# Patient Record
Sex: Female | Born: 1955 | Race: White | Hispanic: No | Marital: Married | Smoking: Never smoker
Health system: Southern US, Community
[De-identification: ages and names within clinical notes are randomized; demographics above are authoritative.]

## PROBLEM LIST (undated history)

## (undated) DIAGNOSIS — E7800 Pure hypercholesterolemia, unspecified: Secondary | ICD-10-CM

## (undated) DIAGNOSIS — J302 Other seasonal allergic rhinitis: Secondary | ICD-10-CM

## (undated) DIAGNOSIS — I10 Essential (primary) hypertension: Secondary | ICD-10-CM

## (undated) DIAGNOSIS — D219 Benign neoplasm of connective and other soft tissue, unspecified: Secondary | ICD-10-CM

## (undated) DIAGNOSIS — E049 Nontoxic goiter, unspecified: Secondary | ICD-10-CM

## (undated) DIAGNOSIS — K635 Polyp of colon: Secondary | ICD-10-CM

## (undated) HISTORY — DX: Benign neoplasm of connective and other soft tissue, unspecified: D21.9

## (undated) HISTORY — DX: Essential (primary) hypertension: I10

---
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
## 2003-07-14 HISTORY — PX: HYSTEROSCOPY W/ ENDOMETRIAL ABLATION: SUR665

## 2003-07-29 ENCOUNTER — Other Ambulatory Visit: Payer: Self-pay

## 2004-03-15 HISTORY — PX: LAPAROSCOPIC TOTAL HYSTERECTOMY: SUR800

## 2004-08-14 ENCOUNTER — Ambulatory Visit: Payer: Self-pay | Admitting: Unknown Physician Specialty

## 2005-01-26 ENCOUNTER — Ambulatory Visit: Payer: Self-pay | Admitting: Unknown Physician Specialty

## 2005-08-13 HISTORY — PX: OTHER SURGICAL HISTORY: SHX169

## 2005-08-19 ENCOUNTER — Ambulatory Visit: Payer: Self-pay | Admitting: Otolaryngology

## 2005-09-02 ENCOUNTER — Ambulatory Visit: Payer: Self-pay | Admitting: Unknown Physician Specialty

## 2006-09-09 ENCOUNTER — Ambulatory Visit: Payer: Self-pay | Admitting: Unknown Physician Specialty

## 2007-09-29 ENCOUNTER — Ambulatory Visit: Payer: Self-pay | Admitting: Unknown Physician Specialty

## 2008-06-13 ENCOUNTER — Ambulatory Visit: Payer: Self-pay | Admitting: Nurse Practitioner

## 2008-06-21 ENCOUNTER — Ambulatory Visit: Payer: Self-pay | Admitting: Nurse Practitioner

## 2008-11-29 ENCOUNTER — Ambulatory Visit: Payer: Self-pay | Admitting: Unknown Physician Specialty

## 2008-12-09 ENCOUNTER — Ambulatory Visit: Payer: Self-pay | Admitting: Unknown Physician Specialty

## 2008-12-23 ENCOUNTER — Ambulatory Visit (HOSPITAL_COMMUNITY): Admission: RE | Admit: 2008-12-23 | Discharge: 2008-12-24 | Payer: Self-pay | Admitting: Neurosurgery

## 2009-03-15 HISTORY — PX: BACK SURGERY: SHX140

## 2009-06-16 ENCOUNTER — Ambulatory Visit: Payer: Self-pay | Admitting: Unknown Physician Specialty

## 2009-12-01 ENCOUNTER — Ambulatory Visit: Payer: Self-pay | Admitting: Unknown Physician Specialty

## 2010-06-18 LAB — CBC
HCT: 40 % (ref 36.0–46.0)
Hemoglobin: 13.4 g/dL (ref 12.0–15.0)
MCHC: 33.5 g/dL (ref 30.0–36.0)
MCV: 89.3 fL (ref 78.0–100.0)
Platelets: 274 10*3/uL (ref 150–400)
RBC: 4.48 MIL/uL (ref 3.87–5.11)
RDW: 12.6 % (ref 11.5–15.5)
WBC: 5.6 10*3/uL (ref 4.0–10.5)

## 2010-12-14 ENCOUNTER — Ambulatory Visit: Payer: Self-pay | Admitting: Gastroenterology

## 2010-12-14 HISTORY — PX: COLONOSCOPY: SHX174

## 2010-12-17 LAB — PATHOLOGY REPORT

## 2010-12-22 ENCOUNTER — Ambulatory Visit: Payer: Self-pay | Admitting: Unknown Physician Specialty

## 2016-03-30 ENCOUNTER — Telehealth: Payer: Self-pay

## 2016-03-30 NOTE — Telephone Encounter (Signed)
[phone] patient received letter to schedule tcs

## 2016-04-06 ENCOUNTER — Telehealth: Payer: Self-pay

## 2016-04-06 ENCOUNTER — Other Ambulatory Visit: Payer: Self-pay

## 2016-04-06 DIAGNOSIS — D369 Benign neoplasm, unspecified site: Secondary | ICD-10-CM

## 2016-04-06 NOTE — Telephone Encounter (Signed)
See separate triage.  

## 2016-04-06 NOTE — Telephone Encounter (Signed)
Gastroenterology Pre-Procedure Review  Request Date: 04/06/2016 Requesting Physician: Angelina Ok, FNP PATIENT REVIEW QUESTIONS: The patient responded to the following health history questions as indicated:    1. Diabetes Melitis: no 2. Joint replacements in the past 12 months: no 3. Major health problems in the past 3 months: no 4. Has an artificial valve or MVP: no 5. Has a defibrillator: no 6. Has been advised in past to take antibiotics in advance of a procedure like teeth cleaning: no 7. Family history of colon cancer: no  8. Alcohol Use: no 9. History of sleep apnea: no  10. History of coronary artery or other vascular stents placed within the last 12 months: no    MEDICATIONS & ALLERGIES:    Patient reports the following regarding taking any blood thinners:   Plavix? no Aspirin? no Coumadin? no Brilinta? no Xarelto? no Eliquis? no Pradaxa? no Savaysa? no Effient? no  Patient confirms/reports the following medications:  Current Outpatient Prescriptions  Medication Sig Dispense Refill  . fluticasone (FLONASE) 50 MCG/ACT nasal spray Place into both nostrils daily.    Marland Kitchen loratadine (CLARITIN) 10 MG tablet Take 10 mg by mouth daily as needed for allergies.    . simvastatin (ZOCOR) 20 MG tablet Take 20 mg by mouth daily.     No current facility-administered medications for this visit.     Patient confirms/reports the following allergies:  No Known Allergies  No orders of the defined types were placed in this encounter.   AUTHORIZATION INFORMATION Primary Insurance:   ID #:   Group #:  Pre-Cert / Auth required: Pre-Cert / Auth #:   Secondary Insurance:  ID #:   Group #:  Pre-Cert / Auth required:  Pre-Cert / Auth #:   SCHEDULE INFORMATION: Procedure has been scheduled as follows:  Date:  05/06/2016         Time:  7:30 AM Location: [facility] Short Stay   This Gastroenterology Pre-Precedure Review Form is being routed to the following provider(s):  R. Garfield Cornea, MD

## 2016-04-07 MED ORDER — NA SULFATE-K SULFATE-MG SULF 17.5-3.13-1.6 GM/177ML PO SOLN: 1.0000 | ORAL | 0 refills | Status: DC

## 2016-04-07 NOTE — Telephone Encounter (Signed)
Rx sent to the pharmacy and instructions mailed to pt.  

## 2016-04-07 NOTE — Telephone Encounter (Signed)
Ok to schedule.

## 2016-04-08 MED ORDER — PEG 3350-KCL-NA BICARB-NACL 420 G PO SOLR: 4000.0000 mL | ORAL | 0 refills | Status: DC

## 2016-04-08 NOTE — Telephone Encounter (Signed)
Suprep not covered by insurance. Trilyte sent in new instructions mailed to pt.

## 2016-04-30 ENCOUNTER — Other Ambulatory Visit: Payer: Self-pay | Admitting: Certified Nurse Midwife

## 2016-05-04 ENCOUNTER — Other Ambulatory Visit: Payer: Self-pay | Admitting: Certified Nurse Midwife

## 2016-05-04 DIAGNOSIS — Z1231 Encounter for screening mammogram for malignant neoplasm of breast: Secondary | ICD-10-CM

## 2016-05-05 NOTE — Telephone Encounter (Signed)
No PA needed for TCS per Algis Greenhouse on 05/05/16 @ 3:40 pm(with [employer])

## 2016-05-06 ENCOUNTER — Encounter (HOSPITAL_COMMUNITY)
Admission: RE | Disposition: A | Discharge status: Home Health | Payer: Self-pay | Source: Ambulatory Visit | Attending: Internal Medicine

## 2016-05-06 ENCOUNTER — Encounter (HOSPITAL_COMMUNITY): Payer: Self-pay | Admitting: *Deleted

## 2016-05-06 ENCOUNTER — Ambulatory Visit (HOSPITAL_COMMUNITY)
Admission: RE | Admit: 2016-05-06 | Discharge: 2016-05-06 | Disposition: A | Discharge status: Home Health | Payer: BLUE CROSS/BLUE SHIELD | Source: Ambulatory Visit | Attending: Internal Medicine | Admitting: Internal Medicine

## 2016-05-06 DIAGNOSIS — Z1211 Encounter for screening for malignant neoplasm of colon: Secondary | ICD-10-CM | POA: Diagnosis present

## 2016-05-06 DIAGNOSIS — K5730 Diverticulosis of large intestine without perforation or abscess without bleeding: Secondary | ICD-10-CM | POA: Diagnosis not present

## 2016-05-06 DIAGNOSIS — D369 Benign neoplasm, unspecified site: Secondary | ICD-10-CM

## 2016-05-06 DIAGNOSIS — Z86010 Personal history of colonic polyps: Secondary | ICD-10-CM | POA: Insufficient documentation

## 2016-05-06 DIAGNOSIS — K644 Residual hemorrhoidal skin tags: Secondary | ICD-10-CM | POA: Insufficient documentation

## 2016-05-06 DIAGNOSIS — E7800 Pure hypercholesterolemia, unspecified: Secondary | ICD-10-CM | POA: Insufficient documentation

## 2016-05-06 DIAGNOSIS — K648 Other hemorrhoids: Secondary | ICD-10-CM | POA: Insufficient documentation

## 2016-05-06 HISTORY — DX: Polyp of colon: K63.5

## 2016-05-06 HISTORY — DX: Nontoxic goiter, unspecified: E04.9

## 2016-05-06 HISTORY — DX: Other seasonal allergic rhinitis: J30.2

## 2016-05-06 HISTORY — DX: Pure hypercholesterolemia, unspecified: E78.00

## 2016-05-06 HISTORY — PX: COLONOSCOPY: SHX5424

## 2016-05-06 SURGERY — COLONOSCOPY
Anesthesia: Moderate Sedation

## 2016-05-06 MED ORDER — MIDAZOLAM HCL 5 MG/5ML IJ SOLN
INTRAMUSCULAR | Status: AC
  Filled 2016-05-06: qty 10

## 2016-05-06 MED ORDER — MEPERIDINE HCL 100 MG/ML IJ SOLN
INTRAMUSCULAR | Status: DC | PRN
  Administered 2016-05-06 (×2): 25 mg via INTRAVENOUS
  Administered 2016-05-06: 50 mg via INTRAVENOUS

## 2016-05-06 MED ORDER — MIDAZOLAM HCL 5 MG/5ML IJ SOLN
INTRAMUSCULAR | Status: DC | PRN
  Administered 2016-05-06: 1 mg via INTRAVENOUS
  Administered 2016-05-06: 2 mg via INTRAVENOUS
  Administered 2016-05-06: 1 mg via INTRAVENOUS
  Administered 2016-05-06: 2 mg via INTRAVENOUS

## 2016-05-06 MED ORDER — STERILE WATER FOR IRRIGATION IR SOLN
Status: DC | PRN
  Administered 2016-05-06: 08:00:00

## 2016-05-06 MED ORDER — SODIUM CHLORIDE 0.9 % IV SOLN
INTRAVENOUS | Status: DC
  Administered 2016-05-06: 07:00:00 via INTRAVENOUS

## 2016-05-06 MED ORDER — ONDANSETRON HCL 4 MG/2ML IJ SOLN
INTRAMUSCULAR | Status: AC
  Filled 2016-05-06: qty 2

## 2016-05-06 MED ORDER — ONDANSETRON HCL 4 MG/2ML IJ SOLN
INTRAMUSCULAR | Status: DC | PRN
  Administered 2016-05-06: 4 mg via INTRAVENOUS

## 2016-05-06 MED ORDER — MEPERIDINE HCL 100 MG/ML IJ SOLN
INTRAMUSCULAR | Status: AC
  Filled 2016-05-06: qty 2

## 2016-05-06 NOTE — Discharge Instructions (Signed)
Colonoscopy Discharge Instructions  Read the instructions outlined below and refer to this sheet in the next few weeks. These discharge instructions provide you with general information on caring for yourself after you leave the hospital. Your doctor may also give you specific instructions. While your treatment has been planned according to the most current medical practices available, unavoidable complications occasionally occur. If you have any problems or questions after discharge, call Dr. Gala Romney at [phone]. ACTIVITY  You may resume your regular activity, but move at a slower pace for the next 24 hours.   Take frequent rest periods for the next 24 hours.   Walking will help get rid of the air and reduce the bloated feeling in your belly (abdomen).   No driving for 24 hours (because of the medicine (anesthesia) used during the test).    Do not sign any important legal documents or operate any machinery for 24 hours (because of the anesthesia used during the test).  NUTRITION  Drink plenty of fluids.   You may resume your normal diet as instructed by your doctor.   Begin with a light meal and progress to your normal diet. Heavy or fried foods are harder to digest and may make you feel sick to your stomach (nauseated).   Avoid alcoholic beverages for 24 hours or as instructed.  MEDICATIONS  You may resume your normal medications unless your doctor tells you otherwise.  WHAT YOU CAN EXPECT TODAY  Some feelings of bloating in the abdomen.   Passage of more gas than usual.   Spotting of blood in your stool or on the toilet paper.  IF YOU HAD POLYPS REMOVED DURING THE COLONOSCOPY:  No aspirin products for 7 days or as instructed.   No alcohol for 7 days or as instructed.   Eat a soft diet for the next 24 hours.  FINDING OUT THE RESULTS OF YOUR TEST Not all test results are available during your visit. If your test results are not back during the visit, make an appointment  with your caregiver to find out the results. Do not assume everything is normal if you have not heard from your caregiver or the medical facility. It is important for you to follow up on all of your test results.  SEEK IMMEDIATE MEDICAL ATTENTION IF:  You have more than a spotting of blood in your stool.   Your belly is swollen (abdominal distention).   You are nauseated or vomiting.   You have a temperature over 101.   You have abdominal pain or discomfort that is severe or gets worse throughout the day.    Colon diverticulosis information provided  Repeat colonoscopy in 5 years   Diverticulosis Diverticulosis is the condition that develops when small pouches (diverticula) form in the wall of your colon. Your colon, or large intestine, is where water is absorbed and stool is formed. The pouches form when the inside layer of your colon pushes through weak spots in the outer layers of your colon. CAUSES  No one knows exactly what causes diverticulosis. RISK FACTORS  Being older than 44. Your risk for this condition increases with age. Diverticulosis is rare in people younger than 40 years. By age 67, almost everyone has it.  Eating a low-fiber diet.  Being frequently constipated.  Being overweight.  Not getting enough exercise.  Smoking.  Taking over-the-counter pain medicines, like aspirin and ibuprofen. SYMPTOMS  Most people with diverticulosis do not have symptoms. DIAGNOSIS  Because diverticulosis often has no  symptoms, health care providers often discover the condition during an exam for other colon problems. In many cases, a health care provider will diagnose diverticulosis while using a flexible scope to examine the colon (colonoscopy). TREATMENT  If you have never developed an infection related to diverticulosis, you may not need treatment. If you have had an infection before, treatment may include:  Eating more fruits, vegetables, and grains.  Taking a fiber  supplement.  Taking a live bacteria supplement (probiotic).  Taking medicine to relax your colon. HOME CARE INSTRUCTIONS   Drink at least 6-8 glasses of water each day to prevent constipation.  Try not to strain when you have a bowel movement.  Keep all follow-up appointments. If you have had an infection before:  Increase the fiber in your diet as directed by your health care provider or dietitian.  Take a dietary fiber supplement if your health care provider approves.  Only take medicines as directed by your health care provider. SEEK MEDICAL CARE IF:   You have abdominal pain.  You have bloating.  You have cramps.  You have not gone to the bathroom in 3 days. SEEK IMMEDIATE MEDICAL CARE IF:   Your pain gets worse.  Yourbloating becomes very bad.  You have a fever or chills, and your symptoms suddenly get worse.  You begin vomiting.  You have bowel movements that are bloody or black. MAKE SURE YOU:  Understand these instructions.  Will watch your condition.  Will get help right away if you are not doing well or get worse. This information is not intended to replace advice given to you by your health care provider. Make sure you discuss any questions you have with your health care provider.

## 2016-05-06 NOTE — Op Note (Signed)
[facility] Patient Name: Adrienne Terrell Procedure Date: 05/06/2016 7:26 AM MRN: [id number] Date of Birth: 12-Apr-1955 Attending MD: Norvel Richards , MD CSN: [id number] Age: 61 Admit Type: Outpatient Procedure:                Colonoscopy - Surveillance Indications:              High risk colon cancer surveillance: Personal                            history of colonic polyps Providers:                Norvel Richards, MD, Otis Peak B. Sharon Seller, RN,                            Isabella Stalling, Technician Referring MD:              Medicines:                Midazolam 6 mg IV, Meperidine 123XX123 mg IV Complications:            No immediate complications. Estimated Blood Loss:     Estimated blood loss: none. Procedure:                Pre-Anesthesia Assessment:                           - Prior to the procedure, a History and Physical                            was performed, and patient medications and                            allergies were reviewed. The patient's tolerance of                            previous anesthesia was also reviewed. The risks                            and benefits of the procedure and the sedation                            options and risks were discussed with the patient.                            All questions were answered, and informed consent                            was obtained. Prior Anticoagulants: The patient has                            taken no previous anticoagulant or antiplatelet                            agents. ASA Grade Assessment: II - A patient with  mild systemic disease. After reviewing the risks                            and benefits, the patient was deemed in                            satisfactory condition to undergo the procedure.                           After obtaining informed consent, the colonoscope                            was passed under direct vision. Throughout the             procedure, the patient's blood pressure, pulse, and                            oxygen saturations were monitored continuously. The                            [id number]) was introduced through the anus                            and advanced to the the cecum, identified by                            appendiceal orifice and ileocecal valve. The                            ileocecal valve, appendiceal orifice, and rectum                            were photographed. The entire colon was well                            visualized. The colonoscopy was performed without                            difficulty. The patient tolerated the procedure                            well. The quality of the bowel preparation was                            adequate. The ileocecal valve, appendiceal orifice,                            and rectum were photographed. Scope In: 7:53:45 AM Scope Out: 8:17:23 AM Scope Withdrawal Time: 0 hours 11 minutes 14 seconds  Total Procedure Duration: 0 hours 23 minutes 38 seconds  Findings:      The perianal and digital rectal examinations were normal except for       minimal externemorrhoids..      Multiple small and large-mouthed diverticula were found in the sigmoid       colon  and descending colon. Internal hemorrhoids?"grade 1 present.      The entire examined colon appeared normal on direct and retroflexion       views. Impression:               - Diverticulosis in the sigmoid colon and in the                            descending colon.                           - The entire examined colon is normal on direct and                            retroflexion views.                           - No specimens collected. Internal and external                            hemorrhoids Moderate Sedation:      Moderate (conscious) sedation was administered by the endoscopy nurse       and supervised by the endoscopist. The following parameters were        monitored: oxygen saturation, heart rate, blood pressure, respiratory       rate, EKG, adequacy of pulmonary ventilation, and response to care.       Total physician intraservice time was 30 minutes. Recommendation:           - Patient has a contact number available for                            emergencies. The signs and symptoms of potential                            delayed complications were discussed with the                            patient. Return to normal activities tomorrow.                            Written discharge instructions were provided to the                            patient.                           - Resume previous diet.                           - Continue present medications.                           - Repeat colonoscopy in 5 years for surveillance.                           - Return to GI office PRN. Procedure Code(s):        ---  Professional ---                           [phone], Colonoscopy, flexible; diagnostic, including                            collection of specimen(s) by brushing or washing,                            when performed (separate procedure)                           99152, Moderate sedation services provided by the                            same physician or other qualified health care                            professional performing the diagnostic or                            therapeutic service that the sedation supports,                            requiring the presence of an independent trained                            observer to assist in the monitoring of the                            patient's level of consciousness and physiological                            status; initial 15 minutes of intraservice time,                            patient age 60 years or older                           [phone], Moderate sedation services; each additional                            15 minutes intraservice time Diagnosis Code(s):         --- Professional ---                           Z86.010, Personal history of colonic polyps                           K57.30, Diverticulosis of large intestine without                            perforation or abscess without bleeding CPT copyright 2016 American Medical Association. All rights reserved. The codes documented in this report are preliminary and upon coder review may  be revised to meet current compliance  requirements. Cristopher Estimable. Mikeila Burgen, MD Norvel Richards, MD 05/06/2016 8:28:28 AM This report has been signed electronically. Number of Addenda: 0

## 2016-05-06 NOTE — H&P (Signed)
_0 @   Primary Care Physician:  Renee Rival, NP Primary Gastroenterologist:  Dr. Gala Romney  Pre-Procedure History & Physical: HPI:  Adrienne Terrell is a 61 y.o. female here for refills colonoscopy. Reported history of colonic adenomas removed in [place] 5 years ago. No bowel symptoms. No family history of colon cancer.  Past Medical History:  Diagnosis Date  . Colon polyps   . Enlarged thyroid gland   . Hypercholesteremia   . Seasonal allergies     Past Surgical History:  Procedure Laterality Date  . ABDOMINAL HYSTERECTOMY    . BACK SURGERY  2010   approximate date  . COLONOSCOPY    . nasal polyps removed      Prior to Admission medications   Medication Sig Start Date End Date Taking? Authorizing Provider  Chlorpheniramine-PSE-Ibuprofen (ADVIL ALLERGY SINUS) 2-30-200 MG TABS Take 1 tablet by mouth as needed (for allergies).   Yes Historical Provider, MD  fluticasone (FLONASE) 50 MCG/ACT nasal spray Place 1 spray into both nostrils daily.    Yes Historical Provider, MD  polyethylene glycol-electrolytes (TRILYTE) 420 g solution Take 4,000 mLs by mouth as directed. 04/08/16  Yes Danie Binder, MD  simvastatin (ZOCOR) 20 MG tablet Take 20 mg by mouth daily.   Yes Historical Provider, MD  Na Sulfate-K Sulfate-Mg Sulf (SUPREP BOWEL PREP KIT) 17.5-3.13-1.6 GM/180ML SOLN Take 1 kit by mouth as directed. Patient not taking: Reported on 04/30/2016 04/07/16   Daneil Dolin, MD    Allergies as of 04/06/2016  . (No Known Allergies)    Family History  Problem Relation Age of Onset  . Colon cancer Neg Hx     Social History   Social History  . Marital status: Married    Spouse name: N/A  . Number of children: N/A  . Years of education: N/A   Occupational History  . Not on file.   Social History Main Topics  . Smoking status: Never Smoker  . Smokeless tobacco: Never Used  . Alcohol use No  . Drug use: No  . Sexual activity: Not on file   Other Topics Concern  .  Not on file   Social History Narrative  . No narrative on file    Review of Systems: See HPI, otherwise negative ROS  Physical Exam: BP (!) 147/95   Pulse 97   Temp 97.7 F (36.5 C) (Oral)   Resp 13   Ht _1  (1.676 m)   Wt 150 lb (68 kg)   SpO2 100%   BMI 24.21 kg/m  General:   Alert,  Well-developed, well-nourished, pleasant and cooperative in NAD Mouth:  No deformity or lesions. Neck:  Supple; no masses or thyromegaly. No significant cervical adenopathy. Lungs:  Clear throughout to auscultation.   No wheezes, crackles, or rhonchi. No acute distress. Heart:  Regular rate and rhythm; no murmurs, clicks, rubs,  or gallops. Abdomen: Non-distended, normal bowel sounds.  Soft and nontender without appreciable mass or hepatosplenomegaly.  Pulses:  Normal pulses noted. Extremities:  Without clubbing or edema.  Impression:  61 year old lady with a history of a colonic adenoma. No bowel symptoms currently. Here for surveillance examination.  Recommendations:  I have offered the patient a surveillance colonoscopy today. The risks, benefits, limitations, alternatives and imponderables have been reviewed with the patient. Questions have been answered. All parties are agreeable.    Notice: This dictation was prepared with Dragon dictation along with smaller phrase technology. Any transcriptional errors that result from this process are  unintentional and may not be corrected upon review.

## 2016-05-07 ENCOUNTER — Encounter (HOSPITAL_COMMUNITY): Payer: Self-pay | Admitting: Internal Medicine

## 2016-05-24 ENCOUNTER — Ambulatory Visit: Payer: BLUE CROSS/BLUE SHIELD

## 2016-06-04 ENCOUNTER — Encounter: Payer: Self-pay | Admitting: Certified Nurse Midwife

## 2016-06-04 ENCOUNTER — Ambulatory Visit
Admission: RE | Admit: 2016-06-04 | Discharge: 2016-06-04 | Disposition: A | Discharge status: Home / Self Care | Payer: BLUE CROSS/BLUE SHIELD | Source: Ambulatory Visit | Attending: Certified Nurse Midwife | Admitting: Certified Nurse Midwife

## 2016-06-04 ENCOUNTER — Ambulatory Visit (INDEPENDENT_AMBULATORY_CARE_PROVIDER_SITE_OTHER): Payer: BLUE CROSS/BLUE SHIELD | Admitting: Certified Nurse Midwife

## 2016-06-04 VITALS — BP 156/88 | HR 94 | Ht 66.0 in | Wt 157.0 lb

## 2016-06-04 DIAGNOSIS — Z1231 Encounter for screening mammogram for malignant neoplasm of breast: Secondary | ICD-10-CM | POA: Insufficient documentation

## 2016-06-04 DIAGNOSIS — R030 Elevated blood-pressure reading, without diagnosis of hypertension: Secondary | ICD-10-CM | POA: Diagnosis not present

## 2016-06-04 DIAGNOSIS — Z01419 Encounter for gynecological examination (general) (routine) without abnormal findings: Secondary | ICD-10-CM

## 2016-06-04 NOTE — Patient Instructions (Signed)
Bone Health Bones protect organs, store calcium, and anchor muscles. Good health habits, such as eating nutritious foods and exercising regularly, are important for maintaining healthy bones. They can also help to prevent a condition that causes bones to lose density and become weak and brittle (osteoporosis). Why is bone mass important? Bone mass refers to the amount of bone tissue that you have. The higher your bone mass, the stronger your bones. An important step toward having healthy bones throughout life is to have strong and dense bones during childhood. A young adult who has a high bone mass is more likely to have a high bone mass later in life. Bone mass at its greatest it is called peak bone mass. A large decline in bone mass occurs in older adults. In women, it occurs about the time of menopause. During this time, it is important to practice good health habits, because if more bone is lost than what is replaced, the bones will become less healthy and more likely to break (fracture). If you find that you have a low bone mass, you may be able to prevent osteoporosis or further bone loss by changing your diet and lifestyle. How can I find out if my bone mass is low? Bone mass can be measured with an X-ray test that is called a bone mineral density (BMD) test. This test is recommended for all women who are age 60 or older. It may also be recommended for men who are age 6 or older, or for people who are more likely to develop osteoporosis due to:  Having bones that break easily.  Having a long-term disease that weakens bones, such as kidney disease or rheumatoid arthritis.  Having menopause earlier than normal.  Taking medicine that weakens bones, such as steroids, thyroid hormones, or hormone treatment for breast cancer or prostate cancer.  Smoking.  Drinking three or more alcoholic drinks each day. What are the nutritional recommendations for healthy bones? To have healthy bones, you need  to get enough of the right minerals and vitamins. Most nutrition experts recommend getting these nutrients from the foods that you eat. Nutritional recommendations vary from person to person. Ask your health care provider what is healthy for you. Here are some general guidelines. Calcium Recommendations  Calcium is the most important (essential) mineral for bone health. Most people can get enough calcium from their diet, but supplements may be recommended for people who are at risk for osteoporosis. Good sources of calcium include:  Dairy products, such as low-fat or nonfat milk, cheese, and yogurt.  Dark green leafy vegetables, such as bok choy and broccoli.  Calcium-fortified foods, such as orange juice, cereal, bread, soy beverages, and tofu products.  Nuts, such as almonds. Follow these recommended amounts for daily calcium intake:  Children, age 583?3: 700 mg.  Children, age 58?8: 1,000 mg.  Children, age 32?13: 1,300 mg.  Teens, age 22?18: 1,300 mg.  Adults, age 38?50: 1,000 mg.  Adults, age 59?70:  Men: 1,000 mg.  Women: [phone] mg.  Adults, age 50 or older: 1000 mg.  Pregnant and breastfeeding females:  Teens: 1,300 mg.  Adults: 1,000 mg. Vitamin D Recommendations  Vitamin D is the most essential vitamin for bone health. It helps the body to absorb calcium. Sunlight stimulates the skin to make vitamin D, so be sure to get enough sunlight. If you live in a cold climate or you do not get outside often, your health care provider may recommend that you take vitamin D  supplements. Good sources of vitamin D in your diet include:  Egg yolks.  Saltwater fish.  Milk and cereal fortified with vitamin D. Follow these recommended amounts for daily vitamin D intake:  Children and teens, age 62?18: 41 international units.  Adults, age 38 or younger: 400-800 international units.  Adults, age 75 or older: 800-1,000 international units. Other Nutrients  Other nutrients for  bone health include:  Phosphorus. This mineral is found in meat, poultry, dairy foods, nuts, and legumes. The recommended daily intake for adult men and adult women is 700 mg.  Magnesium. This mineral is found in seeds, nuts, dark green vegetables, and legumes. The recommended daily intake for adult men is 400?420 mg. For adult women, it is 310?320 mg.  Vitamin K. This vitamin is found in green leafy vegetables. The recommended daily intake is 120 mg for adult men and 90 mg for adult women. What type of physical activity is best for building and maintaining healthy bones? Weight-bearing and strength-building activities are important for building and maintaining peak bone mass. Weight-bearing activities cause muscles and bones to work against gravity. Strength-building activities increases muscle strength that supports bones. Weight-bearing and muscle-building activities include:  Walking and hiking.  Jogging and running.  Dancing.  Gym exercises.  Lifting weights.  Tennis and racquetball.  Climbing stairs.  Aerobics. Adults should get at least 30 minutes of moderate physical activity on most days. Children should get at least 60 minutes of moderate physical activity on most days. Ask your health care provide what type of exercise is best for you. Where can I find more information? For more information, check out the following websites:  [place]: [employer]se  [employer] of Health: http://www.niams.AnonymousEar.fr.asp This information is not intended to replace advice given to you by your health care provider. Make sure you discuss any questions you have with your health care provider. Document Released: 05/22/2003 Document Revised: 09/19/2015 Document Reviewed: 03/06/2014 Elsevier Interactive Patient Education  2017 [employer].

## 2016-06-07 NOTE — Progress Notes (Signed)
Gynecology Annual Exam  PCP: Renee Rival, NP  Chief Complaint:  Chief Complaint  Patient presents with  . Gynecologic Exam    History of Present Illness:  Adrienne Terrell is a 61 year old WF, G2 P2002, who presents for her annual exam. She has a history of a TLH in 06-23-2004 for fibroids. SHe has had no spotting and denies significant  hot flashes.  Her grandson Adrienne Terrell is now 83 yo and will be starting kindergarten in the fall.   Her past medical history is significant for a goiter (has had yearly bopsies by Adrienne Terrell until 06/23/13 and does have yearly ultrasounds and had her last ultrasound a couple of weeks ago). She also has hypercholesterolemia and is taking simvastatin. She recently had her cholesterol checked by her PCP. She also has had some blood pressure elevations at her Pcps office. She reports that her blood pressures at home are normal. Since her last annual in 04/23/2015, she had a colonoscopy which revealed diverticulosis. Her last Pap smear was in 23-Jun-2004. She has no history of abnormal Pap smears. Her most recent mammogram was today and results are pending. There is no family history of breast or ovarian cancer. The patient does do occasional self breast exams. Her mother died of thyroid cancer in 06-24-10.  The patient does not smoke. She does not drink alcohol THe patient does not exercise She does not get enough calcium in her diet       Review of Systems: Review of Systems  Constitutional: Negative for chills, fever and weight loss.  HENT: Positive for congestion. Negative for sinus pain and sore throat.   Eyes: Negative for blurred vision and pain.  Respiratory: Negative for hemoptysis, shortness of breath and wheezing.   Cardiovascular: Negative for chest pain, palpitations and leg swelling.  Gastrointestinal: Negative for abdominal pain, blood in stool, diarrhea, heartburn, nausea and vomiting.  Genitourinary: Negative for dysuria, frequency, hematuria and urgency.    Musculoskeletal: Negative for back pain, joint pain and myalgias.  Skin: Negative for itching and rash.  Neurological: Negative for dizziness, tingling and headaches.  Terrell/Heme/Allergies: Negative for environmental allergies and polydipsia. Does not bruise/bleed easily.       Negative for hirsutism   Psychiatric/Behavioral: Negative for depression. The patient is not nervous/anxious and does not have insomnia.     Past Medical History:  Past Medical History:  Diagnosis Date  . Colon polyps   . Enlarged thyroid gland   . Hypercholesteremia   . Seasonal allergies     Past Surgical History:  Past Surgical History:  Procedure Laterality Date  . BACK SURGERY  06/23/09   microdiscectomy  . BREAST BIOPSY Right Jun 23, 1972   neg  . COLONOSCOPY  12/14/2010   polyps  . COLONOSCOPY N/A 05/06/2016   Procedure: COLONOSCOPY;  Surgeon: Adrienne Dolin, MD;  Location: [facility];  Service: Endoscopy;  Laterality: N/A;  7:30 AM  . HYSTEROSCOPY W/ ENDOMETRIAL ABLATION  07/2003  . LAPAROSCOPIC TOTAL HYSTERECTOMY  06/23/2004   Adrienne [facility]  . nasal polyps removed  08/2005       Family History:  Family History  Problem Relation Age of Onset  . Diabetes Mother   . Thyroid cancer Mother   . Heart disease Father   . Hypertension Father   . Thyroid disease Sister   . Diabetes Paternal Grandmother   . Colon cancer Neg Hx   . Breast cancer Neg Hx     Social History:  Social History   Social History  . Marital status: Married    Spouse name: N/A  . Number of children: 2  . Years of education: N/A   Occupational History  . Pharmacy Technician    Social History Main Topics  . Smoking status: Never Smoker  . Smokeless tobacco: Never Used  . Alcohol use No  . Drug use: No  . Sexual activity: No   Other Topics Concern  . Not on file   Social History Narrative  . No narrative on file    Allergies:  No Known Allergies  Medications: Prior to Admission medications   Medication Sig Start  Date End Date Taking? Authorizing Provider  Chlorpheniramine-PSE-Ibuprofen (ADVIL ALLERGY SINUS) 2-30-200 MG TABS Take 1 tablet by mouth as needed (for allergies).   Yes Historical Provider, MD  simvastatin (ZOCOR) 20 MG tablet Take 20 mg by mouth daily.   Yes Historical Provider, MD    Physical Exam Vitals: Blood pressure (!) 156/88, pulse 94, height 5\' 6"  (1.676 m), weight 71.2 kg (157 lb). Repeat BP:148/90 General: NAD HEENT: normocephalic, anicteric Thyroid: enlarged left lobe, NT Pulmonary: No increased work of breathing, CTAB Cardiovascular: RRR without murmur Breast: Breast symmetrical, no tenderness, no palpable nodules or masses, no skin or nipple retraction present, no nipple discharge.  No axillary or supraclavicular lymphadenopathy. Abdomen: soft, non-tender, non-distended.  Umbilicus without lesions.  No hepatomegaly or masses palpable. No evidence of hernia  Genitourinary:  External: Normal external female genitalia.  Normal urethral meatus, normal Bartholin's and Skene's glands.    Vagina: Normal vaginal mucosa, no evidence of prolapse.    Cervix: surgically absent Uterus: surgically absent  Adnexa:  no adnexal masses, NT  Rectal: deferred  Lymphatic: no evidence of inguinal lymphadenopathy Extremities: no edema, erythema, or tenderness Neurologic: Grossly intact Psychiatric: mood appropriate, affect full     Assessment: 61 y.o. G2P2 well woman exam  Labile/ Elevated blood pressures   Plan:   1) Continue annual screening mammograms/ recommend monthly self breast exams  2) Pap smears- not indicated per ASCCP guideline with history of TLH  3)) Osteoporosis prevention - per USPTF routine screening DEXA at age 12 -discussed calcium and vitamin D3 requirements -Encouraged increased exercise  4) Routine healthcare maintenance including cholesterol, diabetes screening thru PCP  5) Colonoscopy -UTD  6) Follow up 1 year for routine annual  Dalia Heading,  Adrienne [facility]

## 2016-06-10 ENCOUNTER — Encounter: Payer: Self-pay | Admitting: Certified Nurse Midwife

## 2016-06-10 ENCOUNTER — Other Ambulatory Visit: Payer: Self-pay | Admitting: *Deleted

## 2016-06-10 ENCOUNTER — Inpatient Hospital Stay
Admission: RE | Admit: 2016-06-10 | Discharge: 2016-06-10 | Disposition: A | Discharge status: Home / Self Care | Payer: Self-pay | Source: Ambulatory Visit | Attending: *Deleted | Admitting: *Deleted

## 2016-06-10 DIAGNOSIS — Z9289 Personal history of other medical treatment: Secondary | ICD-10-CM

## 2017-04-20 ENCOUNTER — Other Ambulatory Visit: Payer: Self-pay | Admitting: Certified Nurse Midwife

## 2017-04-20 DIAGNOSIS — Z1231 Encounter for screening mammogram for malignant neoplasm of breast: Secondary | ICD-10-CM

## 2017-06-14 ENCOUNTER — Encounter: Payer: Self-pay | Admitting: Certified Nurse Midwife

## 2017-06-14 ENCOUNTER — Ambulatory Visit
Admission: RE | Admit: 2017-06-14 | Discharge: 2017-06-14 | Disposition: A | Discharge status: Home / Self Care | Payer: BLUE CROSS/BLUE SHIELD | Source: Ambulatory Visit | Attending: Certified Nurse Midwife | Admitting: Certified Nurse Midwife

## 2017-06-14 ENCOUNTER — Ambulatory Visit (INDEPENDENT_AMBULATORY_CARE_PROVIDER_SITE_OTHER): Payer: BLUE CROSS/BLUE SHIELD | Admitting: Certified Nurse Midwife

## 2017-06-14 VITALS — BP 118/72 | HR 97 | Ht 66.0 in | Wt 153.0 lb

## 2017-06-14 DIAGNOSIS — Z1231 Encounter for screening mammogram for malignant neoplasm of breast: Secondary | ICD-10-CM | POA: Diagnosis not present

## 2017-06-14 DIAGNOSIS — Z1272 Encounter for screening for malignant neoplasm of vagina: Secondary | ICD-10-CM | POA: Diagnosis not present

## 2017-06-14 DIAGNOSIS — Z8639 Personal history of other endocrine, nutritional and metabolic disease: Secondary | ICD-10-CM

## 2017-06-14 DIAGNOSIS — I10 Essential (primary) hypertension: Secondary | ICD-10-CM | POA: Insufficient documentation

## 2017-06-14 DIAGNOSIS — Z1239 Encounter for other screening for malignant neoplasm of breast: Secondary | ICD-10-CM

## 2017-06-14 DIAGNOSIS — Z01419 Encounter for gynecological examination (general) (routine) without abnormal findings: Secondary | ICD-10-CM

## 2017-06-14 DIAGNOSIS — E785 Hyperlipidemia, unspecified: Secondary | ICD-10-CM | POA: Insufficient documentation

## 2017-06-14 NOTE — Progress Notes (Signed)
Gynecology Annual Exam  PCP: [facility]   Chief Complaint:  Chief Complaint  Patient presents with  . Gynecologic Exam    History of Present Illness:Adrienne Terrell presents today for her annual gyn exam. She is a 62 year old White female , G 2 P 2 0 0 2 , who has a history of a TLH in 30-Jun-2004 for fibroids.  She denies hot flashes. Her grandson [name] is now 3 1/2 years old ([name] is her daughter).   She has had no spotting.   The patient's past medical history is notable for goiter (has had yearly biopsies by [facility] until 2015-ultrasound showed no change in size). In addition she had a tubular adenomatous polyp removed during her colonoscopy in 07/01/10. She had a colonoscopy 04/2016 which was normal. Next one due in 5 years. Her PCP is in [facility]  Since her last annual GYN exam dated 06/04/2016 , she was diagnosed with hyperlipidemia and hypertension and is taking Hydrodiuril and simvastatin.  Her husband has had a prostatectomy for prostate cancer and has ED. She does experience some vaginal dryness, but rarely has IC.    Her most recent pap smear was obtained 30-Jun-2004 and was normal. She desires to have a Pap smear today Her most recent mammogram obtained on 06/04/2016 was normal. There is a family history of breast cancer in her sister at age 108 (DCIS). Patient does not know if her sister had genetic testing. There is no family history of ovarian cancer. The patient does do monthly self breast exams. Her mother died from thyroid cancer in 07/01/10   She denies a recent DEXA scan. The patient does not smoke.  The patient does not drink alcohol.  The patient does not use illegal drugs.  The patient does not exercise regularly. Has recently obtained a stationary bike and uses that once/week.  The patient may not get adequate calcium in her diet.  She had a recent cholesterol screen in 06/30/16 that was abnormal.    Review of Systems: Review of Systems    Constitutional: Positive for fever and malaise/fatigue. Negative for chills and weight loss.  HENT: Positive for congestion. Negative for sinus pain and sore throat.   Eyes: Negative for blurred vision and pain.  Respiratory: Positive for cough (and sneezing). Negative for hemoptysis, shortness of breath and wheezing.   Cardiovascular: Negative for chest pain, palpitations and leg swelling.  Gastrointestinal: Negative for abdominal pain, blood in stool, diarrhea, heartburn, nausea and vomiting.  Genitourinary: Negative for dysuria, frequency, hematuria and urgency.  Musculoskeletal: Negative for back pain, joint pain and myalgias.  Skin: Negative for itching and rash.  Neurological: Negative for dizziness, tingling and headaches.  Endo/Heme/Allergies: Positive for environmental allergies. Negative for polydipsia. Does not bruise/bleed easily.       Negative for hirsutism   Psychiatric/Behavioral: Negative for depression. The patient is not nervous/anxious and does not have insomnia.     Past Medical History:  Past Medical History:  Diagnosis Date  . Colon polyps   . Enlarged thyroid gland   . Fibroids   . Hypercholesteremia   . Hypertension   . Seasonal allergies     Past Surgical History:  Past Surgical History:  Procedure Laterality Date  . BACK SURGERY  June 30, 2009   microdiscectomy  . BREAST BIOPSY Right 06/30/1972   neg  . COLONOSCOPY  12/14/2010   polyps  . COLONOSCOPY N/A 05/06/2016   Procedure: COLONOSCOPY;  Surgeon: Daneil Dolin, MD;  Location: [facility];  Service: Endoscopy;  Laterality: N/A;  7:30 AM  . HYSTEROSCOPY W/ ENDOMETRIAL ABLATION  07/2003  . LAPAROSCOPIC TOTAL HYSTERECTOMY  06-21-2004   Dr Rayford Halsted  . nasal polyps removed  08/2005    Family History:  Family History  Problem Relation Age of Onset  . Diabetes Mother   . Thyroid cancer Mother 63       deceased 22-Jun-2010  . Heart disease Father   . Hypertension Father   . Thyroid disease Sister   . Breast cancer  Sister 65  . Diabetes Paternal Grandmother   . Colon cancer Neg Hx     Social History:  Social History   Socioeconomic History  . Marital status: Married    Spouse name: Not on file  . Number of children: 2  . Years of education: Not on file  . Highest education level: Not on file  Occupational History  . Occupation: Education administrator  Social Needs  . Financial resource strain: Not on file  . Food insecurity:    Worry: Not on file    Inability: Not on file  . Transportation needs:    Medical: Not on file    Non-medical: Not on file  Tobacco Use  . Smoking status: Never Smoker  . Smokeless tobacco: Never Used  Substance and Sexual Activity  . Alcohol use: No  . Drug use: No  . Sexual activity: Not Currently    Birth control/protection: Post-menopausal  Lifestyle  . Physical activity:    Days per week: 0 days    Minutes per session: 0 min  . Stress: Only a little  Relationships  . Social connections:    Talks on phone: Not on file    Gets together: Not on file    Attends religious service: Not on file    Active member of club or organization: Not on file    Attends meetings of clubs or organizations: Not on file    Relationship status: Not on file  . Intimate partner violence:    Fear of current or ex partner: Not on file    Emotionally abused: Not on file    Physically abused: Not on file    Forced sexual activity: Not on file  Other Topics Concern  . Not on file  Social History Narrative  . Not on file    Allergies:  No Known Allergies  Medications:  Current Outpatient Medications on File Prior to Visit  Medication Sig Dispense Refill  . fluticasone (FLONASE) 50 MCG/ACT nasal spray   4  . hydrochlorothiazide (HYDRODIURIL) 12.5 MG tablet   4  . simvastatin (ZOCOR) 20 MG tablet Take 20 mg by mouth daily.     No current facility-administered medications on file prior to visit.    Physical Exam Vitals: BP 118/72   Pulse 97   Ht 5\' 6"  (1.676 m)    Wt 69.4 kg (153 lb)   BMI 24.69 kg/m   General: WF in  NAD HEENT: normocephalic, anicteric Neck:: left lobe enlarged, NT, no cervical lymphadenopathy  Pulmonary: No increased work of breathing, CTAB Cardiovascular: RRR, without murmur  Breast: Breast symmetrical, no tenderness, no palpable nodules or masses, no skin or nipple retraction present, no nipple discharge.  No axillary, infraclavicular or supraclavicular lymphadenopathy. Abdomen: Soft, non-tender, non-distended.  Umbilicus without lesions.  No hepatomegaly or masses palpable. No evidence of hernia. Genitourinary:  External: Normal external female genitalia.  Normal urethral meatus,  normal  Bartholin's and Skene's glands.    Vagina: Normal vaginal mucosa, no evidence of prolapse.    Cervix: surgically absent  Uterus: surgically absent  Adnexa: No adnexal masses, non-tender  Rectal: deferred  Lymphatic: no evidence of inguinal lymphadenopathy Extremities: no edema, erythema, or tenderness Neurologic: Grossly intact Psychiatric: mood appropriate, affect full     Assessment: 62 y.o. [id number] annual gyn exam  Plan:   1) Breast cancer screening - recommend monthly self breast examand annual screening mammogram. Mammogram was ordered today. Has appointment scheduled for this afternoon  2) Vaginal  cancer screening - Pap was done per patient request. ASCCP guidelines and rational discussed.  Patient opts for every 3 years screening interval  3) Routine healthcare maintenance including cholesterol and diabetes screening managed by PCP   4) Colon cancer screening: colonoscopy due 2023  5) Osteoporosis prevention: discussed calcium and vitamins D3 requirements. Explained role of exercise in prevention of osteoporosis.  6) RTO in 1 year and prn.   Dalia Heading, CNM

## 2017-06-14 NOTE — Patient Instructions (Signed)
Vitamin D3 1000 IU daily.

## 2017-06-15 LAB — PAP IG (IMAGE GUIDED): PAP SMEAR COMMENT: 0

## 2017-06-25 ENCOUNTER — Encounter: Payer: Self-pay | Admitting: Certified Nurse Midwife

## 2017-06-25 DIAGNOSIS — J302 Other seasonal allergic rhinitis: Secondary | ICD-10-CM | POA: Insufficient documentation

## 2017-06-25 DIAGNOSIS — Z8639 Personal history of other endocrine, nutritional and metabolic disease: Secondary | ICD-10-CM | POA: Insufficient documentation

## 2018-09-11 ENCOUNTER — Telehealth: Payer: Self-pay

## 2018-09-11 NOTE — Telephone Encounter (Signed)
Pt calling; needs order to have mammogram done at [facility].  [phone]

## 2018-09-12 ENCOUNTER — Other Ambulatory Visit: Payer: Self-pay | Admitting: Certified Nurse Midwife

## 2018-09-12 DIAGNOSIS — Z1239 Encounter for other screening for malignant neoplasm of breast: Secondary | ICD-10-CM

## 2018-09-12 NOTE — Telephone Encounter (Signed)
Orders placed and patient notified that she can schedule mammogram.

## 2018-09-12 NOTE — Telephone Encounter (Signed)
Patient is calling to follow up on orders for Mammograms. Please advise and notify patient when she can schedule . Thank you!

## 2018-10-05 ENCOUNTER — Ambulatory Visit: Payer: BLUE CROSS/BLUE SHIELD | Admitting: Certified Nurse Midwife

## 2018-10-20 ENCOUNTER — Ambulatory Visit
Admission: RE | Admit: 2018-10-20 | Discharge: 2018-10-20 | Disposition: A | Discharge status: Home / Self Care | Payer: BC Managed Care – PPO | Source: Ambulatory Visit | Attending: Certified Nurse Midwife | Admitting: Certified Nurse Midwife

## 2018-10-20 DIAGNOSIS — Z1239 Encounter for other screening for malignant neoplasm of breast: Secondary | ICD-10-CM

## 2018-10-20 DIAGNOSIS — Z1231 Encounter for screening mammogram for malignant neoplasm of breast: Secondary | ICD-10-CM | POA: Diagnosis present

## 2019-09-21 ENCOUNTER — Telehealth: Payer: Self-pay

## 2019-09-21 ENCOUNTER — Other Ambulatory Visit: Payer: Self-pay | Admitting: Certified Nurse Midwife

## 2019-09-21 DIAGNOSIS — Z1231 Encounter for screening mammogram for malignant neoplasm of breast: Secondary | ICD-10-CM

## 2019-09-21 NOTE — Telephone Encounter (Signed)
Pt calling; has appt c CLG 8/19; needs order sent for mammogram at [facility].  [phone]

## 2019-09-21 NOTE — Telephone Encounter (Signed)
done

## 2019-10-30 ENCOUNTER — Ambulatory Visit
Admission: RE | Admit: 2019-10-30 | Discharge: 2019-10-30 | Disposition: A | Discharge status: Home / Self Care | Payer: BC Managed Care – PPO | Source: Ambulatory Visit | Attending: Certified Nurse Midwife | Admitting: Certified Nurse Midwife

## 2019-10-30 ENCOUNTER — Other Ambulatory Visit: Payer: Self-pay

## 2019-10-30 DIAGNOSIS — Z1231 Encounter for screening mammogram for malignant neoplasm of breast: Secondary | ICD-10-CM | POA: Diagnosis present

## 2019-11-01 ENCOUNTER — Ambulatory Visit: Payer: Self-pay | Admitting: Certified Nurse Midwife

## 2019-11-15 ENCOUNTER — Telehealth: Payer: Self-pay

## 2019-11-15 NOTE — Telephone Encounter (Signed)
Pt calling for mammogram results.  [phone]  Pt aware of neg mammogram.

## 2020-11-11 ENCOUNTER — Other Ambulatory Visit: Payer: Self-pay | Admitting: Family Medicine

## 2020-11-11 DIAGNOSIS — Z1231 Encounter for screening mammogram for malignant neoplasm of breast: Secondary | ICD-10-CM

## 2020-12-04 ENCOUNTER — Ambulatory Visit
Admission: RE | Admit: 2020-12-04 | Discharge: 2020-12-04 | Disposition: A | Discharge status: Home / Self Care | Payer: BC Managed Care – PPO | Source: Ambulatory Visit | Attending: Family Medicine | Admitting: Family Medicine

## 2020-12-04 ENCOUNTER — Other Ambulatory Visit: Payer: Self-pay

## 2020-12-04 DIAGNOSIS — Z1231 Encounter for screening mammogram for malignant neoplasm of breast: Secondary | ICD-10-CM | POA: Insufficient documentation

## 2021-06-09 ENCOUNTER — Encounter: Payer: Self-pay | Admitting: *Deleted

## 2021-07-02 ENCOUNTER — Telehealth (INDEPENDENT_AMBULATORY_CARE_PROVIDER_SITE_OTHER): Payer: Self-pay | Admitting: *Deleted

## 2021-07-02 NOTE — Telephone Encounter (Signed)
Patient did receive her letter and questionnaire for her repeat TCS.  She will be on Medicare in June with a supplement and wants to wait until then.   ? ?She would have to meet a deductible and would just like not to have right now. ? ?Has h/o polyps and had her surveillance 5 years ago none but is due her 5 year again this year.  ?

## 2021-07-02 NOTE — Telephone Encounter (Signed)
Noted.  Scheduled nurse visit by phone for 09/10/2021 at 11:00. ?

## 2021-08-19 ENCOUNTER — Encounter: Payer: Self-pay | Admitting: *Deleted

## 2021-09-10 ENCOUNTER — Ambulatory Visit: Payer: BC Managed Care – PPO

## 2021-12-03 ENCOUNTER — Other Ambulatory Visit: Payer: Self-pay | Admitting: Family Medicine

## 2021-12-03 DIAGNOSIS — Z1231 Encounter for screening mammogram for malignant neoplasm of breast: Secondary | ICD-10-CM

## 2022-02-02 ENCOUNTER — Ambulatory Visit
Admission: RE | Admit: 2022-02-02 | Discharge: 2022-02-02 | Disposition: A | Discharge status: Home / Self Care | Payer: Medicare Other | Source: Ambulatory Visit | Attending: Family Medicine | Admitting: Family Medicine

## 2022-02-02 DIAGNOSIS — Z1231 Encounter for screening mammogram for malignant neoplasm of breast: Secondary | ICD-10-CM | POA: Diagnosis present

## 2022-12-13 ENCOUNTER — Other Ambulatory Visit: Payer: Self-pay | Admitting: Family Medicine

## 2022-12-13 DIAGNOSIS — Z1231 Encounter for screening mammogram for malignant neoplasm of breast: Secondary | ICD-10-CM

## 2023-02-22 ENCOUNTER — Ambulatory Visit
Admission: RE | Admit: 2023-02-22 | Discharge: 2023-02-22 | Disposition: A | Discharge status: Home / Self Care | Payer: Medicare Other | Source: Ambulatory Visit | Attending: Family Medicine | Admitting: Family Medicine

## 2023-02-22 DIAGNOSIS — Z1231 Encounter for screening mammogram for malignant neoplasm of breast: Secondary | ICD-10-CM | POA: Diagnosis present

## 2023-07-16 IMAGING — MG MM DIGITAL SCREENING BILAT W/ TOMO AND CAD
6 of 10 series · 6 of 30 positions shown · non-contrast
Comparison: Previous exam(s).

CLINICAL DATA: Screening.

EXAM:
DIGITAL SCREENING BILATERAL MAMMOGRAM WITH TOMOSYNTHESIS AND CAD
TECHNIQUE: Bilateral screening digital craniocaudal and mediolateral oblique
mammograms were obtained. Bilateral screening digital breast
tomosynthesis was performed. The images were evaluated with
computer-aided detection.

[L MLO synth-2D]
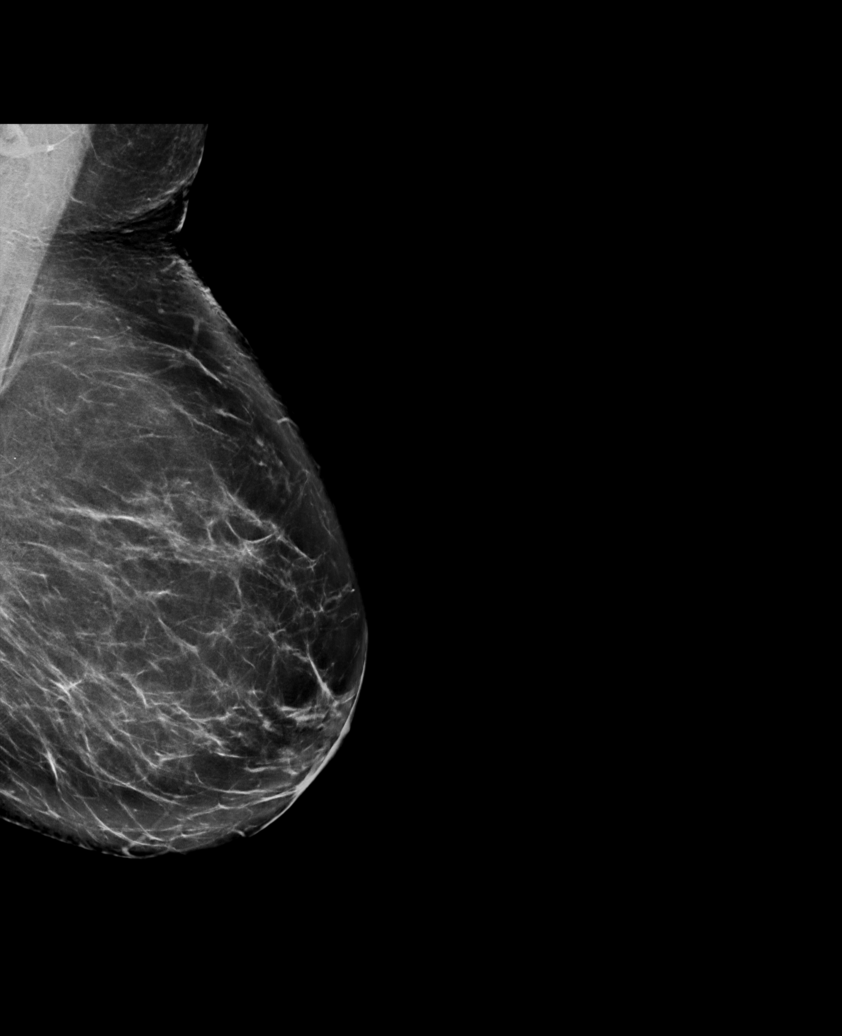

[R MLO synth-2D (1 of 2)]
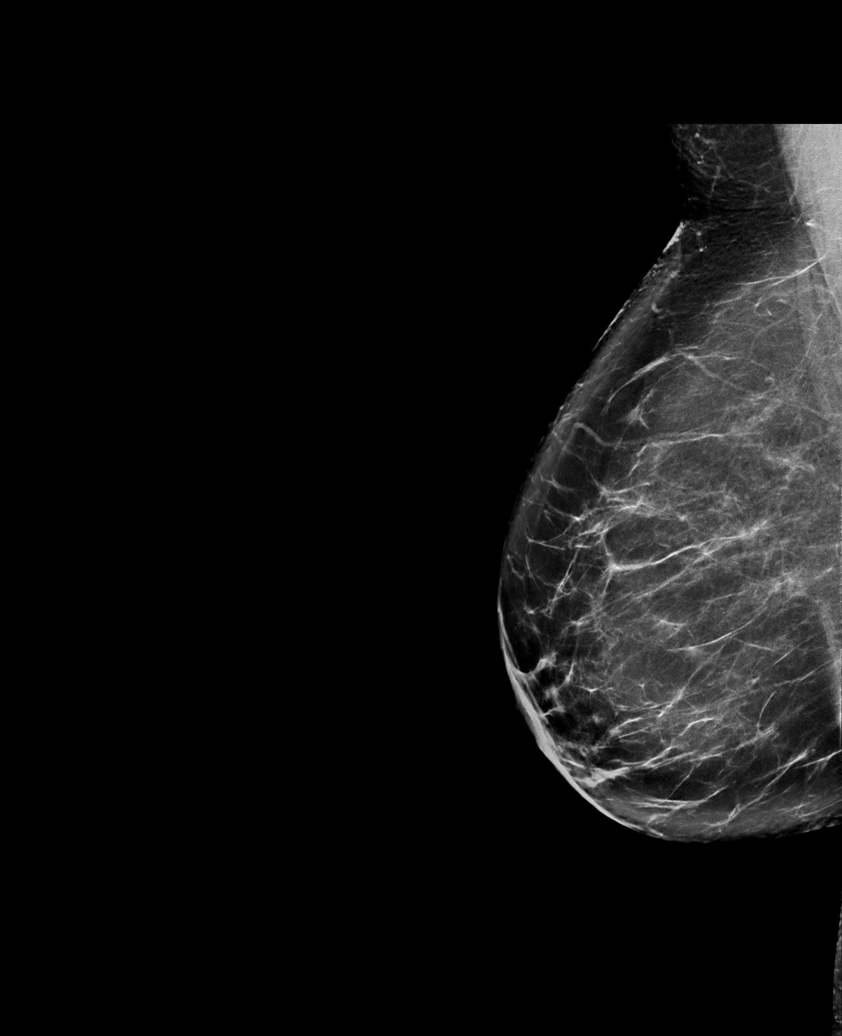

[R MLO synth-2D (2 of 2)]
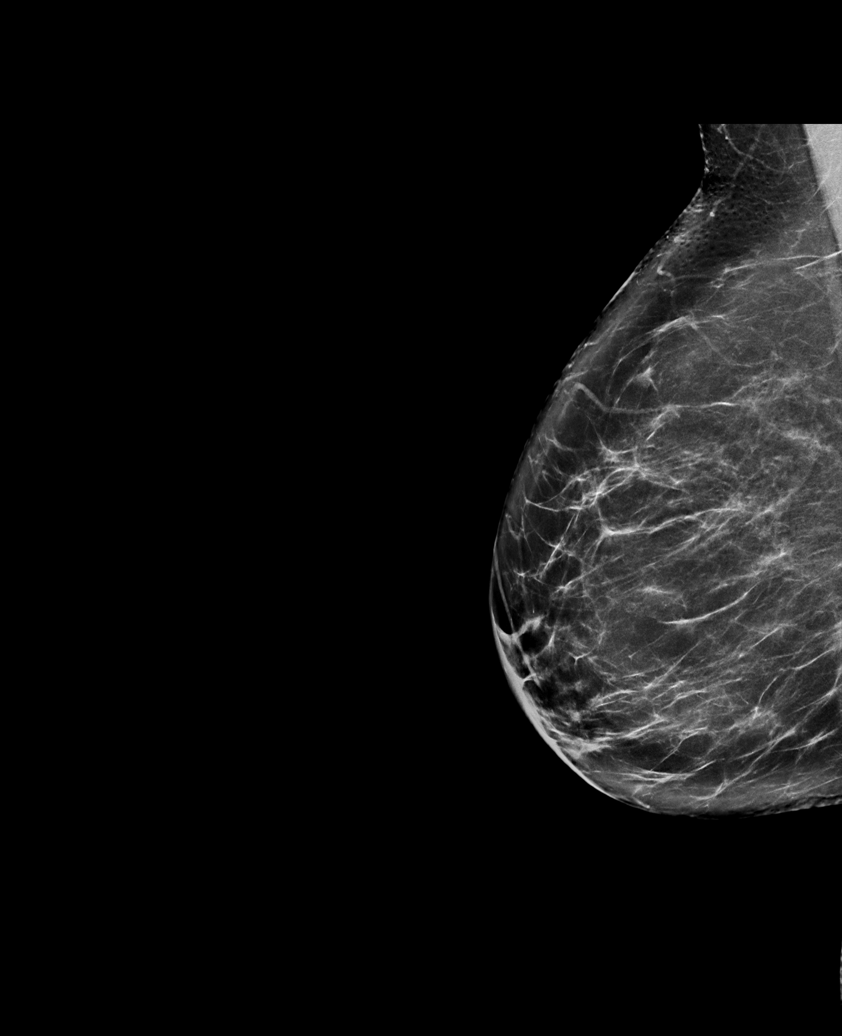

[L CC synth-2D]
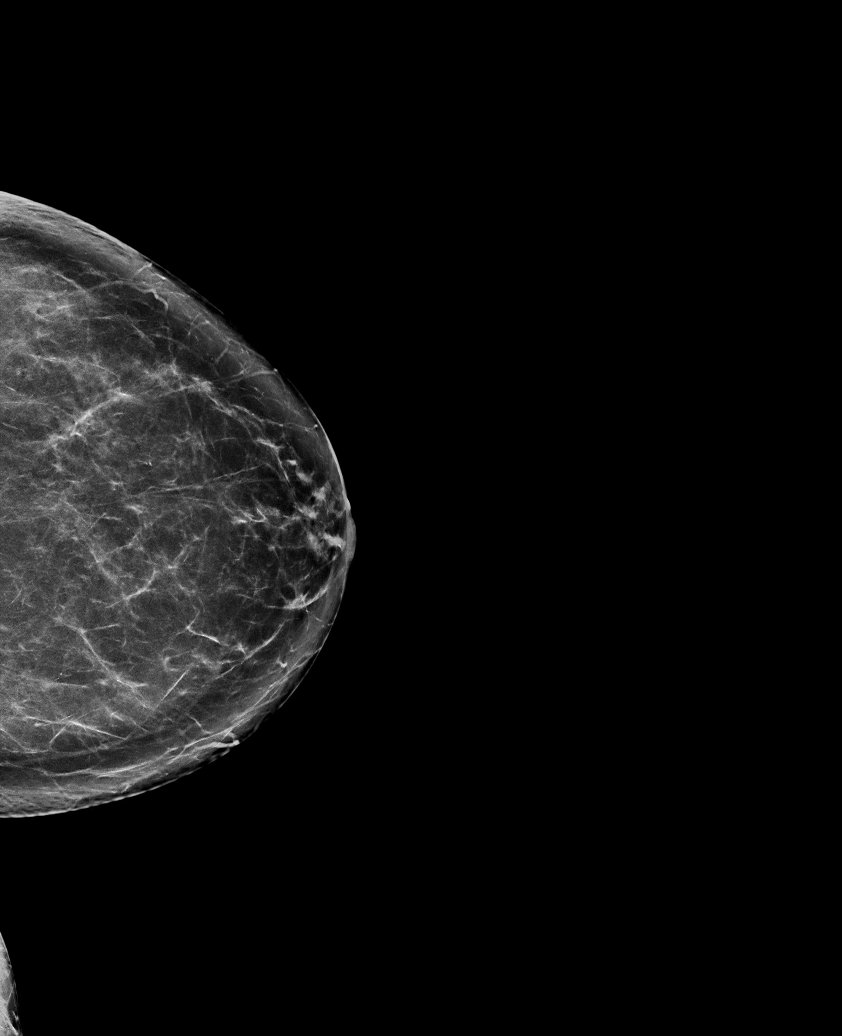

[R CC synth-2D]
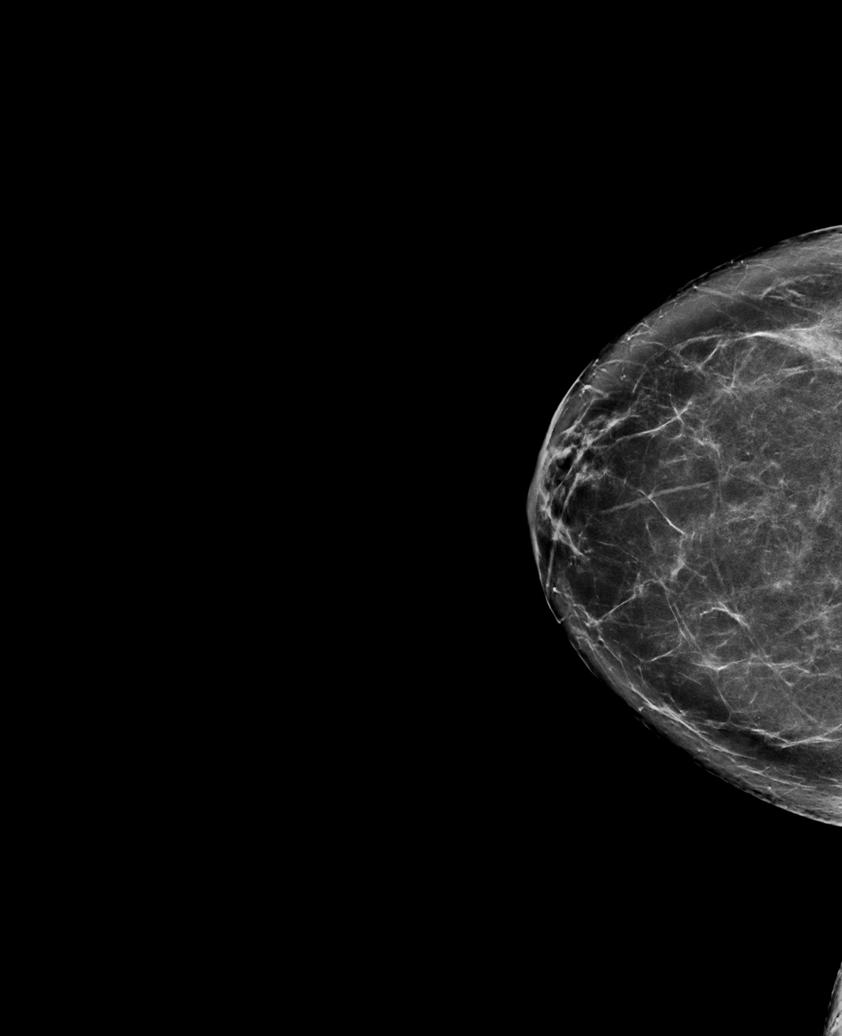

[R CC tomo · tomo slice 39/77.0]
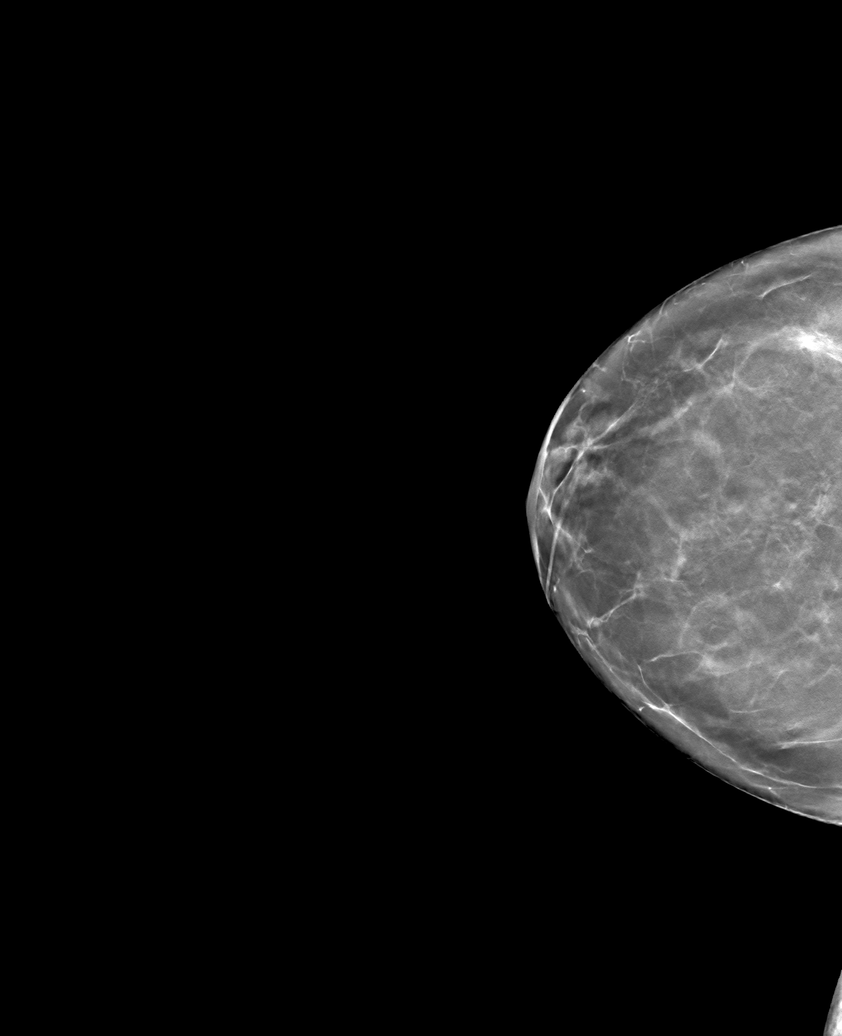

[6 of 30 positions shown; findings below may reference images not displayed]

ACR Breast Density Category b: There are scattered areas of
fibroglandular density.
FINDINGS: There are no findings suspicious for malignancy.
IMPRESSION: No mammographic evidence of malignancy. A result letter of this
screening mammogram will be mailed directly to the patient.

RECOMMENDATION:
Screening mammogram in one year. (Code:51-O-LD2)

BI-RADS CATEGORY  1: Negative.

## 2024-01-24 ENCOUNTER — Other Ambulatory Visit: Payer: Self-pay | Admitting: Family Medicine

## 2024-01-24 DIAGNOSIS — Z1231 Encounter for screening mammogram for malignant neoplasm of breast: Secondary | ICD-10-CM

## 2024-03-05 ENCOUNTER — Ambulatory Visit
Admission: RE | Admit: 2024-03-05 | Discharge: 2024-03-05 | Disposition: A | Discharge status: Home / Self Care | Payer: PRIVATE HEALTH INSURANCE | Source: Ambulatory Visit | Attending: Family Medicine | Admitting: Family Medicine

## 2024-03-05 DIAGNOSIS — Z1231 Encounter for screening mammogram for malignant neoplasm of breast: Secondary | ICD-10-CM | POA: Insufficient documentation
